# Patient Record
Sex: Female | Born: 1945 | Race: White | Hispanic: No | Marital: Married | Smoking: Former smoker
Health system: Southern US, Community
[De-identification: ages and names within clinical notes are randomized; demographics above are authoritative.]

## PROBLEM LIST (undated history)

## (undated) DIAGNOSIS — C801 Malignant (primary) neoplasm, unspecified: Secondary | ICD-10-CM

## (undated) DIAGNOSIS — I10 Essential (primary) hypertension: Secondary | ICD-10-CM

## (undated) HISTORY — PX: ABDOMINAL HYSTERECTOMY: SHX81

## (undated) HISTORY — PX: ABDOMINAL SURGERY: SHX537

---

## 2001-04-06 ENCOUNTER — Ambulatory Visit (HOSPITAL_COMMUNITY): Admission: RE | Admit: 2001-04-06 | Discharge: 2001-04-06 | Payer: Self-pay | Admitting: Family Medicine

## 2001-04-06 ENCOUNTER — Encounter: Payer: Self-pay | Admitting: Family Medicine

## 2001-05-10 ENCOUNTER — Ambulatory Visit (HOSPITAL_COMMUNITY): Admission: RE | Admit: 2001-05-10 | Discharge: 2001-05-11 | Payer: Self-pay | Admitting: Otolaryngology

## 2017-05-18 ENCOUNTER — Emergency Department (HOSPITAL_COMMUNITY): Payer: Medicare HMO

## 2017-05-18 ENCOUNTER — Emergency Department (HOSPITAL_COMMUNITY)
Admission: EM | Admit: 2017-05-18 | Discharge: 2017-05-18 | Disposition: A | Discharge status: Home / Self Care | Payer: Medicare HMO | Attending: Emergency Medicine | Admitting: Emergency Medicine

## 2017-05-18 ENCOUNTER — Encounter (HOSPITAL_COMMUNITY): Payer: Self-pay | Admitting: *Deleted

## 2017-05-18 DIAGNOSIS — R0789 Other chest pain: Secondary | ICD-10-CM | POA: Diagnosis present

## 2017-05-18 DIAGNOSIS — M545 Low back pain: Secondary | ICD-10-CM | POA: Insufficient documentation

## 2017-05-18 DIAGNOSIS — Z5321 Procedure and treatment not carried out due to patient leaving prior to being seen by health care provider: Secondary | ICD-10-CM | POA: Diagnosis not present

## 2017-05-18 HISTORY — DX: Essential (primary) hypertension: I10

## 2017-05-18 HISTORY — DX: Malignant (primary) neoplasm, unspecified: C80.1

## 2017-05-18 LAB — CBC
HCT: 34.1 % — ABNORMAL LOW (ref 36.0–46.0)
HEMOGLOBIN: 10.8 g/dL — AB (ref 12.0–15.0)
MCH: 30.6 pg (ref 26.0–34.0)
MCHC: 31.7 g/dL (ref 30.0–36.0)
MCV: 96.6 fL (ref 78.0–100.0)
Platelets: 480 10*3/uL — ABNORMAL HIGH (ref 150–400)
RBC: 3.53 MIL/uL — AB (ref 3.87–5.11)
RDW: 17.1 % — ABNORMAL HIGH (ref 11.5–15.5)
WBC: 11.4 10*3/uL — ABNORMAL HIGH (ref 4.0–10.5)

## 2017-05-18 LAB — BASIC METABOLIC PANEL
ANION GAP: 12 (ref 5–15)
BUN: 24 mg/dL — ABNORMAL HIGH (ref 6–20)
CALCIUM: 8.4 mg/dL — AB (ref 8.9–10.3)
CO2: 25 mmol/L (ref 22–32)
Chloride: 100 mmol/L — ABNORMAL LOW (ref 101–111)
Creatinine, Ser: 1.02 mg/dL — ABNORMAL HIGH (ref 0.44–1.00)
GFR, EST NON AFRICAN AMERICAN: 54 mL/min — AB (ref >60)
Glucose, Bld: 132 mg/dL — ABNORMAL HIGH (ref 65–99)
Potassium: 3.4 mmol/L — ABNORMAL LOW (ref 3.5–5.1)
Sodium: 137 mmol/L (ref 135–145)

## 2017-05-18 LAB — I-STAT TROPONIN, ED: TROPONIN I, POC: 0 ng/mL (ref 0.00–0.08)

## 2017-05-18 NOTE — ED Triage Notes (Signed)
Pt had some abdominal pain on Friday and now resolved.  Pt has mid chest pain that started on Saturday intermittently and now more consistent and higher pain. Pt reports lower back pain.  Pt has had decreased appetite.   Pt has stage 4 colon cancer and on oral chemo.

## 2017-05-18 NOTE — ED Notes (Signed)
Updated husband on expected wait times. Husband reports they will return tomorrow to get results from medical records.

## 2017-05-20 ENCOUNTER — Other Ambulatory Visit: Payer: Self-pay

## 2017-05-20 ENCOUNTER — Emergency Department (HOSPITAL_COMMUNITY): Payer: Medicare HMO

## 2017-05-20 ENCOUNTER — Emergency Department (HOSPITAL_COMMUNITY)
Admission: EM | Admit: 2017-05-20 | Discharge: 2017-05-21 | Disposition: A | Discharge status: Home / Self Care | Payer: Medicare HMO | Attending: Emergency Medicine | Admitting: Emergency Medicine

## 2017-05-20 ENCOUNTER — Encounter (HOSPITAL_COMMUNITY): Payer: Self-pay | Admitting: *Deleted

## 2017-05-20 DIAGNOSIS — K56609 Unspecified intestinal obstruction, unspecified as to partial versus complete obstruction: Secondary | ICD-10-CM | POA: Diagnosis not present

## 2017-05-20 DIAGNOSIS — I10 Essential (primary) hypertension: Secondary | ICD-10-CM | POA: Diagnosis not present

## 2017-05-20 DIAGNOSIS — K769 Liver disease, unspecified: Secondary | ICD-10-CM | POA: Insufficient documentation

## 2017-05-20 DIAGNOSIS — R1084 Generalized abdominal pain: Secondary | ICD-10-CM | POA: Diagnosis not present

## 2017-05-20 DIAGNOSIS — R188 Other ascites: Secondary | ICD-10-CM | POA: Diagnosis not present

## 2017-05-20 DIAGNOSIS — R413 Other amnesia: Secondary | ICD-10-CM | POA: Insufficient documentation

## 2017-05-20 DIAGNOSIS — R109 Unspecified abdominal pain: Secondary | ICD-10-CM | POA: Diagnosis present

## 2017-05-20 DIAGNOSIS — Z87891 Personal history of nicotine dependence: Secondary | ICD-10-CM | POA: Insufficient documentation

## 2017-05-20 DIAGNOSIS — Z85038 Personal history of other malignant neoplasm of large intestine: Secondary | ICD-10-CM | POA: Insufficient documentation

## 2017-05-20 DIAGNOSIS — Z79899 Other long term (current) drug therapy: Secondary | ICD-10-CM | POA: Insufficient documentation

## 2017-05-20 DIAGNOSIS — R079 Chest pain, unspecified: Secondary | ICD-10-CM | POA: Insufficient documentation

## 2017-05-20 LAB — BASIC METABOLIC PANEL
ANION GAP: 11 (ref 5–15)
BUN: 25 mg/dL — AB (ref 6–20)
CALCIUM: 8 mg/dL — AB (ref 8.9–10.3)
CO2: 23 mmol/L (ref 22–32)
Chloride: 100 mmol/L — ABNORMAL LOW (ref 101–111)
Creatinine, Ser: 1.06 mg/dL — ABNORMAL HIGH (ref 0.44–1.00)
GFR calc Af Amer: 59 mL/min — ABNORMAL LOW (ref >60)
GFR, EST NON AFRICAN AMERICAN: 51 mL/min — AB (ref >60)
Glucose, Bld: 138 mg/dL — ABNORMAL HIGH (ref 65–99)
Potassium: 3.4 mmol/L — ABNORMAL LOW (ref 3.5–5.1)
Sodium: 134 mmol/L — ABNORMAL LOW (ref 135–145)

## 2017-05-20 LAB — CBC
HCT: 35 % — ABNORMAL LOW (ref 36.0–46.0)
HEMOGLOBIN: 11.2 g/dL — AB (ref 12.0–15.0)
MCH: 30.5 pg (ref 26.0–34.0)
MCHC: 32 g/dL (ref 30.0–36.0)
MCV: 95.4 fL (ref 78.0–100.0)
Platelets: 479 10*3/uL — ABNORMAL HIGH (ref 150–400)
RBC: 3.67 MIL/uL — ABNORMAL LOW (ref 3.87–5.11)
RDW: 17.5 % — ABNORMAL HIGH (ref 11.5–15.5)
WBC: 13.7 10*3/uL — ABNORMAL HIGH (ref 4.0–10.5)

## 2017-05-20 LAB — I-STAT TROPONIN, ED
TROPONIN I, POC: 0.01 ng/mL (ref 0.00–0.08)
Troponin i, poc: 0.02 ng/mL (ref 0.00–0.08)

## 2017-05-20 MED ORDER — SODIUM CHLORIDE 0.9 % IV BOLUS (SEPSIS)
1000.0000 mL | Freq: Once | INTRAVENOUS | Status: AC
  Administered 2017-05-20: 1000 mL via INTRAVENOUS

## 2017-05-20 MED ORDER — IOPAMIDOL (ISOVUE-300) INJECTION 61%
INTRAVENOUS | Status: AC
  Filled 2017-05-20: qty 100

## 2017-05-20 MED ORDER — FENTANYL CITRATE (PF) 100 MCG/2ML IJ SOLN
25.0000 ug | Freq: Once | INTRAMUSCULAR | Status: AC
  Administered 2017-05-20: 25 ug via INTRAVENOUS

## 2017-05-20 MED ORDER — FENTANYL CITRATE (PF) 100 MCG/2ML IJ SOLN
INTRAMUSCULAR | Status: AC
  Filled 2017-05-20: qty 2

## 2017-05-20 NOTE — ED Triage Notes (Signed)
Pt was here on 9/12 but left prior to being seen by edp. Pt having chest pain that started as intermittent and then became constant and severe. Had episodes of n/v. Hx of colon cancer. ekg done and no acute distress is noted at triage.

## 2017-05-20 NOTE — ED Provider Notes (Signed)
[facility] DEPT Provider Note   CSN: [id number] Arrival date & time: 05/20/17  1501     History   Chief Complaint Chief Complaint  Patient presents with  . Chest Pain  . Abdominal Pain    HPI Nichole Wolf is a 71 y.o. female.  HPI Over the last week has developed severe pain in abdomen and chest, under sternum. Has been coming on for a while, scheduled to go to [place] but canceled due to symptoms.  Has lost weight due to surgery 2 years aog, but has been losing weight again, low appetite.  500 calories per day per husband.     Husband reports 2 weeks of abdominal pain.  Patient denies having any symptoms but husband reports she has been having severe pain, keeping her from sleep.  She remembers keeping him up last night but does not remember where pain was.  He reports her not remembering is new.  She says "well I'm not going senile!"    This last week was worse.  Often throughout the day.  Was laying down most of the week.  When awake seemed to be in pain. Usually points to high area in belly.  Will report chest pain at different times as abdominal pain.  Abdominal pain not immediately after eating but suspect it seems like it is worse after eating. Last night had chicken noodle soup.  Last night had severe pain after eating.    No shortness of breath. One episode of vomiting days ago.  Constipation, took senakot 10 days ago, other than that has had normal BM.      Past Medical History:  Diagnosis Date  . Cancer ([place])    stage 4 colon cancer on oral chemo  . Hypertension     There are no active problems to display for this patient.   Past Surgical History:  Procedure Laterality Date  . ABDOMINAL HYSTERECTOMY    . ABDOMINAL SURGERY     colon resection    OB History    No data available       Home Medications    Prior to Admission medications   Medication Sig Start Date End Date Taking? Authorizing Provider  amLODipine (NORVASC) 5 MG tablet Take 5 mg by  mouth daily.   Yes [provider]  Bevacizumab (AVASTIN IV) Inject into the vein every 21 ( twenty-one) days. Last infusion 05/03/17   Yes [provider]  capecitabine (XELODA) 500 MG tablet Take 1,000 mg by mouth See admin instructions. Take 2 tablets (1000 mg) by mouth twice daily for two weeks, stop for one week and then repeat.   Yes [provider]  gabapentin (NEURONTIN) 300 MG capsule Take 300 mg by mouth at bedtime.   Yes [provider]  ibuprofen (ADVIL,MOTRIN) 200 MG tablet Take 200-400 mg by mouth every 6 (six) hours as needed (pain).   Yes [provider]  Ibuprofen-Diphenhydramine Cit (ADVIL PM PO) Take 1-2 tablets by mouth at bedtime as needed (pain).   Yes [provider]  LORazepam (ATIVAN) 1 MG tablet Take 1 mg by mouth 2 (two) times daily as needed for anxiety or sleep.   Yes [provider]  mirtazapine (REMERON) 30 MG tablet Take 30 mg by mouth at bedtime.   Yes [provider]  olmesartan (BENICAR) 40 MG tablet Take 40 mg by mouth daily.   Yes [provider]  ondansetron (ZOFRAN) 8 MG tablet Take 8 mg by mouth every 8 (  eight) hours as needed for nausea or vomiting.   Yes [provider]  senna (SENOKOT) 8.6 MG tablet Take 1 tablet by mouth daily as needed for constipation.   Yes [provider]  venlafaxine XR (EFFEXOR-XR) 150 MG 24 hr capsule Take 150 mg by mouth See admin instructions. Take 1 capsule (150 mg) by mouth with 75 mg capsule for a 225 mg dose - every morning   Yes [provider]  venlafaxine XR (EFFEXOR-XR) 75 MG 24 hr capsule Take 75 mg by mouth See admin instructions. Take 1 capsule (75 mg) by mouth with 150 mg capsule for a 225 mg dose - every morning   Yes [provider]    Family History History reviewed. No pertinent family history.  Social History Social History  Substance Use Topics  . Smoking status: Former Research scientist (life sciences)  . Smokeless  tobacco: Never Used  . Alcohol use No     Allergies   Sulfa antibiotics   Review of Systems Review of Systems  Constitutional: Positive for appetite change and fatigue. Negative for fever.  HENT: Negative for sore throat.   Eyes: Negative for visual disturbance.  Respiratory: Negative for cough and shortness of breath.   Cardiovascular: Positive for chest pain.  Gastrointestinal: Positive for abdominal pain. Negative for diarrhea, nausea and vomiting.  Genitourinary: Negative for difficulty urinating and dysuria.  Musculoskeletal: Negative for back pain and neck pain.  Skin: Negative for rash.  Neurological: Negative for syncope and headaches.     Physical Exam Updated Vital Signs BP 134/84   Pulse (!) 101   Temp 98 F (36.7 C) (Oral)   Resp 15   SpO2 95%   Physical Exam  Constitutional: She appears cachectic. No distress.  HENT:  Head: Normocephalic and atraumatic.  Eyes: Conjunctivae and EOM are normal.  Neck: Normal range of motion.  Cardiovascular: Normal rate, regular rhythm, normal heart sounds and intact distal pulses.  Exam reveals no gallop and no friction rub.   No murmur heard. Pulmonary/Chest: Effort normal and breath sounds normal. No respiratory distress. She has no wheezes. She has no rales. She exhibits tenderness.  Abdominal: Soft. She exhibits no distension. There is tenderness (diffuse). There is no guarding.  Musculoskeletal: She exhibits no edema or tenderness.  Neurological: She is alert.  Skin: Skin is warm and dry. No rash noted. She is not diaphoretic. No erythema.  Psychiatric: She is agitated.  Nursing note and vitals reviewed.    ED Treatments / Results  Labs (all labs ordered are listed, but only abnormal results are displayed) Labs Reviewed  BASIC METABOLIC PANEL - Abnormal; Notable for the following:       Result Value   Sodium 134 (*)    Potassium 3.4 (*)    Chloride 100 (*)    Glucose, Bld 138 (*)    BUN 25 (*)     Creatinine, Ser 1.06 (*)    Calcium 8.0 (*)    GFR calc non Af Amer 51 (*)    GFR calc Af Amer 59 (*)    All other components within normal limits  CBC - Abnormal; Notable for the following:    WBC 13.7 (*)    RBC 3.67 (*)    Hemoglobin 11.2 (*)    HCT 35.0 (*)    RDW 17.5 (*)    Platelets 479 (*)    All other components within normal limits  HEPATIC FUNCTION PANEL - Abnormal; Notable for the following:  Total Protein 5.1 (*)    Albumin 2.4 (*)    ALT 11 (*)    Alkaline Phosphatase 139 (*)    All other components within normal limits  LIPASE, BLOOD  AMMONIA  I-STAT TROPONIN, ED  I-STAT TROPONIN, ED    EKG  EKG Interpretation  Date/Time:  Friday May 20 2017 15:08:55 EDT Ventricular Rate:  113 PR Interval:  134 QRS Duration: 74 QT Interval:  340 QTC Calculation: 466 R Axis:   69 Text Interpretation:  Sinus tachycardia Otherwise normal ECG No significant change since last tracing Confirmed by Gareth Morgan [phone]) on 05/20/2017 8:31:03 PM Also confirmed by Gareth Morgan [phone]), editor Philomena Doheny [phone])  on 05/21/2017 9:15:19 AM       Radiology Ct Head Wo Contrast  Result Date: 05/21/2017 CLINICAL DATA:  Diffuse pain. History of hypertension, stage IV colon cancer. EXAM: CT HEAD WITHOUT CONTRAST TECHNIQUE: Contiguous axial images were obtained from the base of the skull through the vertex without intravenous contrast. COMPARISON:  None. FINDINGS: BRAIN: No intraparenchymal hemorrhage, mass effect nor midline shift. The ventricles and sulci are normal for age. Patchy supratentorial white matter hypodensities less than expected for patient's age, though non-specific are most compatible with chronic small vessel ischemic disease. No acute large vascular territory infarcts. No abnormal extra-axial fluid collections. Basal cisterns are patent. VASCULAR: Mild calcific atherosclerosis of the carotid siphons. SKULL: No skull fracture. Moderate RIGHT temporomandibular  osteoarthrosis. No significant scalp soft tissue swelling. SINUSES/ORBITS: Trace paranasal sinus mucosal thickening. Mastoid air cells are well aerated. Soft tissue within the external auditory canals compatible with cerumen. The included ocular globes and orbital contents are non-suspicious. OTHER: Subcutaneous gas RIGHT face and included palate. Soft tissue density nasal labial fold seen with filler material. IMPRESSION: 1. Normal noncontrast CT HEAD for age. 2. Subcutaneous gas RIGHT lower face, this may be within the oral cavity, from recent intravenous access for subcutaneous injection. Recommend direct inspection. Electronically Signed   By: Elon Alas M.D.   On: 05/21/2017 00:19   Ct Abdomen Pelvis W Contrast  Result Date: 05/21/2017 CLINICAL DATA:  Abdominal pain. Patient reports pain everywhere for an unspecified amount of time. EXAM: CT ABDOMEN AND PELVIS WITH CONTRAST TECHNIQUE: Multidetector CT imaging of the abdomen and pelvis was performed using the standard protocol following bolus administration of intravenous contrast. CONTRAST:  100 cc Isovue-300 IV COMPARISON:  None. FINDINGS: Lower chest: Lower lobe atelectasis. No pleural fluid. Fluid distending the distal esophagus with wall thickening. Hepatobiliary: 4.2 cm low-density lesion in the right lobe of the liver, borderline Hounsfield units of 20 for simple cyst. Subcapsular low-density lesion in the inferior right lobe is elongated, incompletely characterized. There is perihepatic ascites. Gallbladder wall thickening is nonspecific in the setting of ascites. No calcified stone. No biliary dilatation. Pancreas: Atrophic parenchyma. No ductal dilatation or inflammation. Spleen: Normal in size without focal abnormality. Adrenals/Urinary Tract: No adrenal nodule. Moderate to severe right hydronephrosis, likely chronic with marked thinning of the right renal parenchyma. Right ureter appears decompressed. Compensatory hypertrophy of the left  kidney. Multiple low-density lesions within both kidneys are incompletely characterize but likely cysts. Urinary bladder is physiologically distended without wall thickening. Stomach/Bowel: Bowel anatomy is limited given lack of enteric contrast and paucity of intra-abdominal fat. Fluid distends distal esophagus with wall thickening. Fluid within the stomach which is nondistended. Fluid-filled markedly dilated small bowel loops in the upper abdomen. Possible transition point in the central abdomen image 29 series 3. More distal small bowel loops  are less distended but also fluid-filled. Fluid throughout the ascending, transverse and descending colon. Enteric sutures at the base of the cecum. There is diffuse mucosal enhancement throughout small and large bowel. Vascular/Lymphatic: Mild aortic tortuosity without aneurysm. No bulky adenopathy. Reproductive: Status post hysterectomy. No adnexal masses. Other: Upper abdominal ascites, may be partially loculated with peripheral enhancement. No free air. Musculoskeletal: Scoliosis and degenerative change in the spine. Scattered sclerotic densities in the pelvis. No destructive bone lesion. IMPRESSION: 1. Findings suspicious for small bowel obstruction with marked distention of upper small bowel loops, possible transition point in the central abdomen. However, some small bowel loops distally are also fluid-filled, as well as fluid throughout the colon. There is diffuse mucosal enhancement throughout the large and small bowel, raising concern for concurrent enteritis. 2. Small to moderate upper abdominal ascites may be partially loculated with peripheral enhancement, suggesting complex fluid. 3. Pericholecystic fluid is nonspecific in the setting of ascites, likely reactive. 4. Liver lesions, largest measuring 4.2 cm, likely a complex cyst. Recommend correlation with previous imaging if available. In the absence of previous imaging, consider MR characterization on an  elective basis after coalescence of acute illness when patient is able tolerate breath hold technique. 5. Chronic right hydronephrosis with renal parenchymal thinning, may be congenital UPJ obstruction. Bilateral low-density renal lesions are likely cysts but incompletely characterized. Electronically Signed   By: Jeb Levering M.D.   On: 05/21/2017 00:33    Procedures Procedures (including critical care time)  Medications Ordered in ED Medications  sodium chloride 0.9 % bolus 1,000 mL (0 mLs Intravenous Stopped 05/21/17 0147)  fentaNYL (SUBLIMAZE) injection 25 mcg (25 mcg Intravenous Given 05/20/17 2332)     Initial Impression / Assessment and Plan / ED Course  I have reviewed the triage vital signs and the nursing notes.  Pertinent labs & imaging results that were available during my care of the patient were reviewed by me and considered in my medical decision making (see chart for details).     71 year old female with a history of stage IV colon cancer and hypertension presents with concern for abdominal pain and chest pain. History is limited as patient denies chest pain, and does not recall details of her abdominal pain, however husband is providing much history. EKG shows no acute changes. Troponin is within normal limits. Chest x-ray done 2 days ago shows no sign of pneumothorax or pneumonia.She and husband deny shortness of breath, she has normal oxygenation on room air, no pleuritic component of pain, no asymmetricleg pain or swelling, and overall given patient's denial of chest pain with husbands report of only sternal pain, doubt pulmonary embolus. Doubt ACS given negative troponin x 2, patient denial of chest pain.  Patient does acknowledge abdominal pain and significant tenderness but is unable to provide detailed history.  CP may be radiating from abdomen.  Given husband reporting her inability to remember details of pain is new, ordered head CT and ammonia.  Ammonia WNL. CT head  without acute findings.   Diffuse tenderness on exam, CT abdomen pelvis ordered showing possible bowel obstruction and peripherally enhancing ascitic fluid collection.  Loculated ascites noted on prior CT[facility] completed at [facility], however given presence of pain discussed with patient and husband that I cannot rule out whether this fluid may be malignant or may be infected. Given fluid has been persistent in this area and pericholecystitic I doubt that it represents fluid from acute cholecystitis.  Recommended admission and IR sampling in AM.  Regarding  radiographic finding of bowel obstruction, discussed with patient and husband that bowel obstruction is typically both clinical and radiographic diagnosis --and while she is not distended and has not had vomiting today to suggest SOB, her history is limited and she does not remember when she last had BM or if she has passed flatus today, and it is certainly possible SBO is etiology of pain. Recommend bowel rest, IV hydration, admission.   Discussed my recommendation of admission for abdominal pain, IV hydration, bowel rest, IR guided paracentesis to rule out SBP with patient and husband in detial.  Patient is adamant about getting home to her dog and after husband discussed with her he reports she is adamant about leaving and they will leave against medical advice.  Discussed I would like her to stay, and risks of bowel perforation, sepsis, death with them.  The radiographic findings are not definitive for diagnosis of bowel obstruction or SBP,as clinical history not classic for SBO, and loculated ascites present on prior scans--however I feel admission for observation and continued testing is most appropriate.  Discussed with husband that if she develops vomiting, worsening pain, fevers they must immediately return to ED. Also discussed finding of liver lesions new from prior concerning for metastatic disease.      Final Clinical Impressions(s) / ED Diagnoses    Final diagnoses:  Generalized abdominal pain  Chest pain, unspecified type  Other ascites, suspect malignant ascites, cannot rule out potential infection  Small bowel obstruction ([place]), suspected based on CT  Liver lesion, cystic, concern for metastatic disease given this was not present on prior CT    New Prescriptions Discharge Medication List as of 05/21/2017  1:39 AM       Gareth Morgan, MD 05/21/17 1050

## 2017-05-21 ENCOUNTER — Other Ambulatory Visit: Payer: Self-pay

## 2017-05-21 LAB — HEPATIC FUNCTION PANEL
ALBUMIN: 2.4 g/dL — AB (ref 3.5–5.0)
ALT: 11 U/L — AB (ref 14–54)
AST: 18 U/L (ref 15–41)
Alkaline Phosphatase: 139 U/L — ABNORMAL HIGH (ref 38–126)
BILIRUBIN DIRECT: 0.2 mg/dL (ref 0.1–0.5)
Indirect Bilirubin: 0.6 mg/dL (ref 0.3–0.9)
Total Bilirubin: 0.8 mg/dL (ref 0.3–1.2)
Total Protein: 5.1 g/dL — ABNORMAL LOW (ref 6.5–8.1)

## 2017-05-21 LAB — AMMONIA: Ammonia: 34 umol/L (ref 9–35)

## 2017-05-21 LAB — LIPASE, BLOOD: LIPASE: 33 U/L (ref 11–51)

## 2017-05-21 NOTE — ED Notes (Signed)
75 mcg Fentanyl wasted with Charm Rings

## 2017-05-21 NOTE — ED Notes (Signed)
Patient Alert and oriented X4. Stable and ambulatory. Patient verbalized understanding of the discharge instructions.  Patient belongings were taken by the patient.  

## 2017-08-06 DEATH — deceased

## 2018-02-27 IMAGING — CT CT HEAD W/O CM
4 series · 16 of 47 positions shown, 18 images · non-contrast
Comparison: None.

CLINICAL DATA: Diffuse pain. History of hypertension, stage IV
colon cancer.

EXAM:
CT HEAD WITHOUT CONTRAST
TECHNIQUE: Contiguous axial images were obtained from the base of the skull
through the vertex without intravenous contrast.

[Series 3: head wo · axial · 0.40mm/px · z∈[-186,-66]mm · 7 of 32 slices shown, 9 images]
[im 4/32  brain]
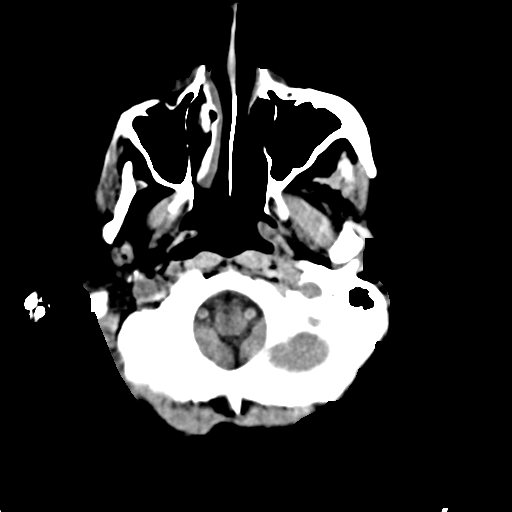
[im 4/32  bone]
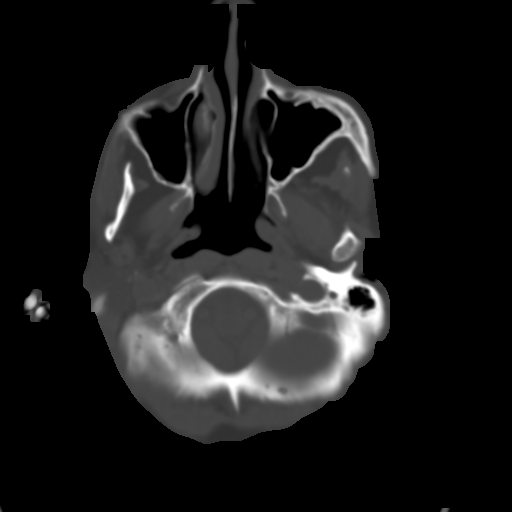
[im 8/32  brain]
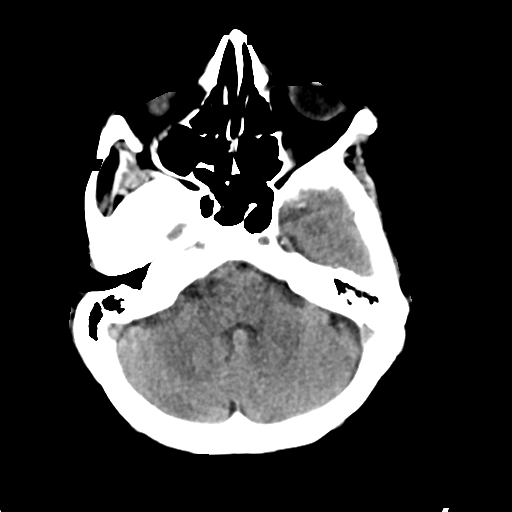
[im 12/32  brain]
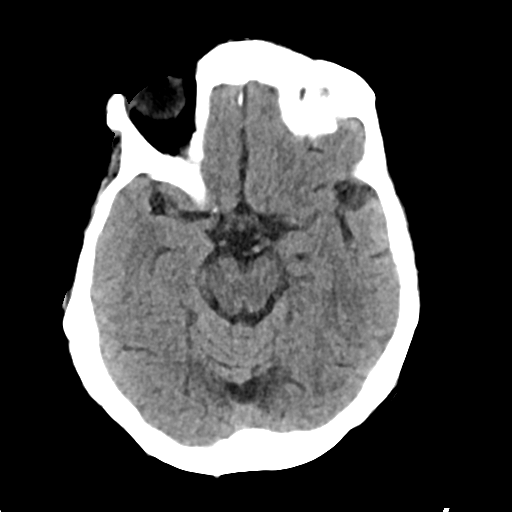
[im 16/32  brain]
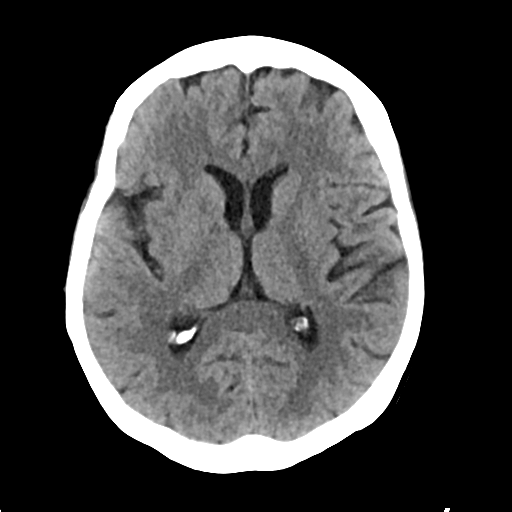
[im 20/32  brain]
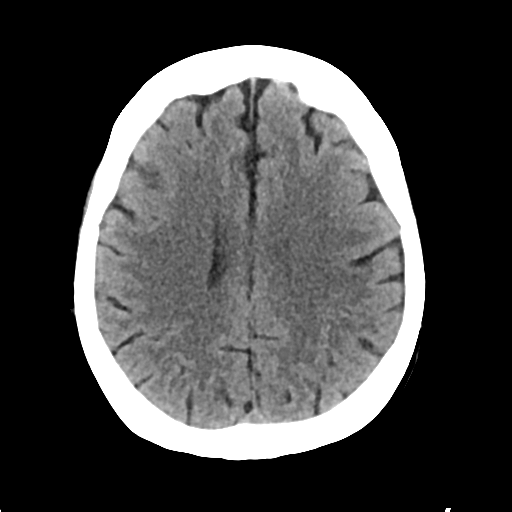
[im 20/32  bone]
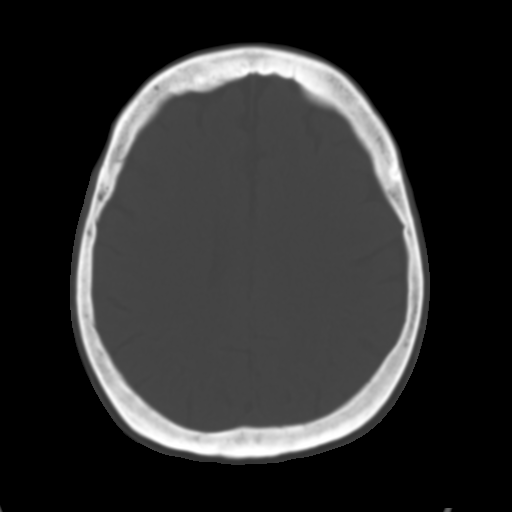
[im 24/32  brain]
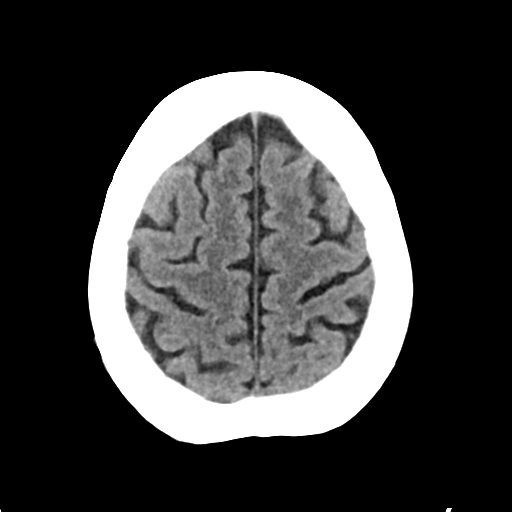
[im 28/32  brain]
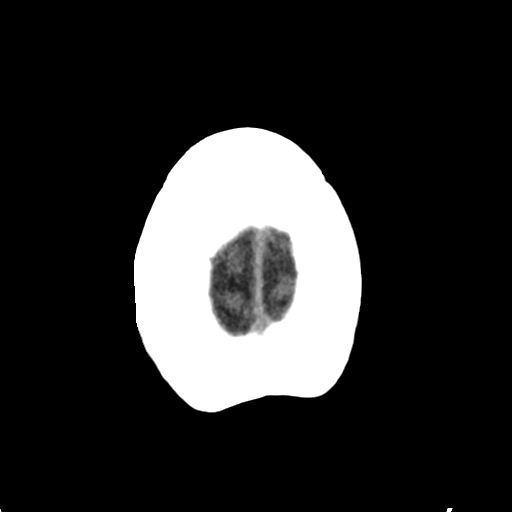

[Series 4: head bone · axial · 0.40mm/px · z∈[-188,-156]mm · 3 of 80 slices shown]
[im 8/80  bone]
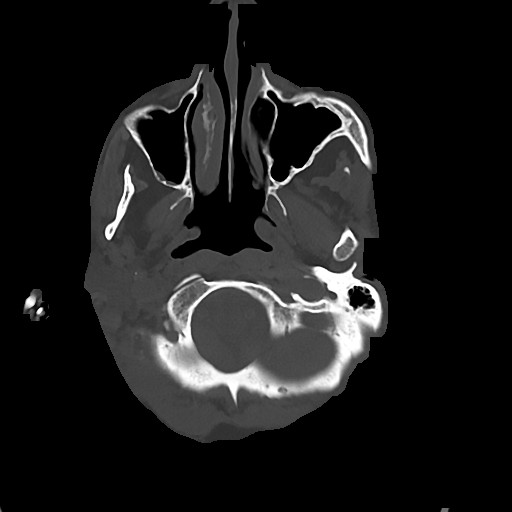
[im 16/80  bone]
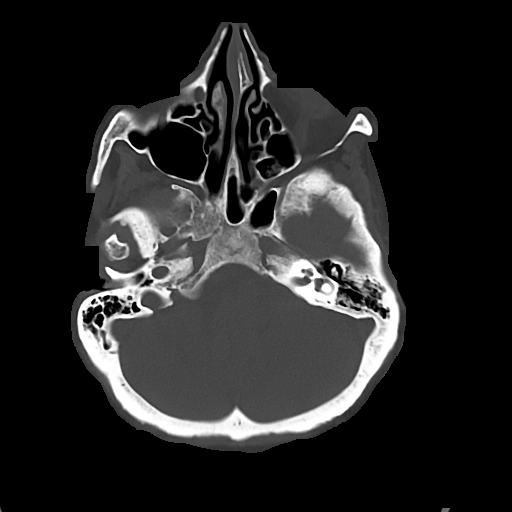
[im 24/80  bone]
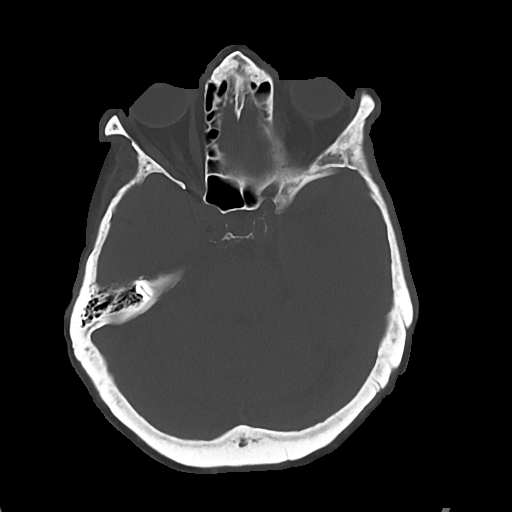

[Series 5: cor soft · coronal · 0.31mm/px · 3 of 67 slices shown]
[im 23/67  brain]
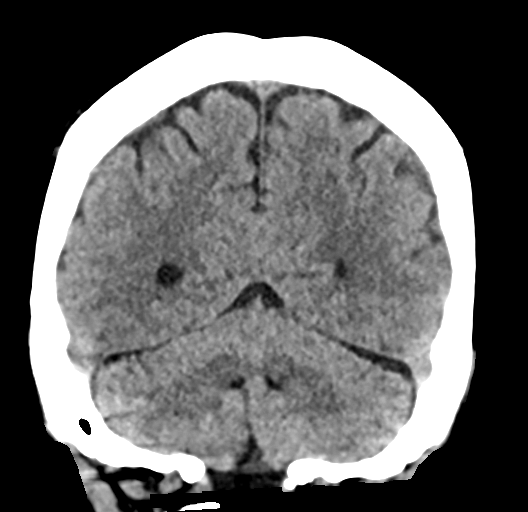
[im 30/67  brain]
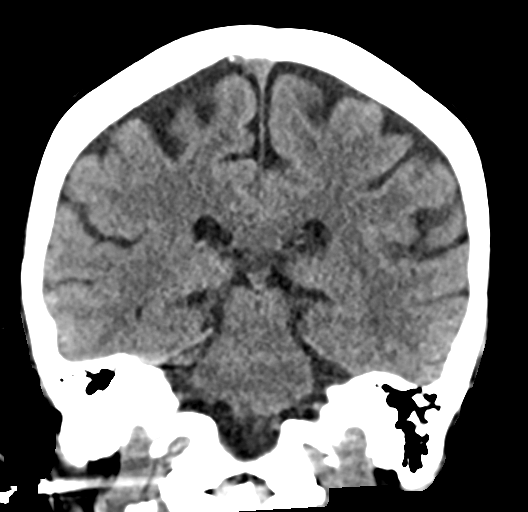
[im 37/67  brain]
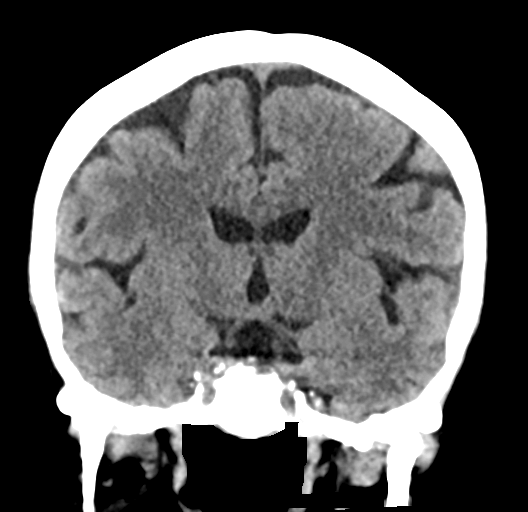

[Series 6: sag soft · sagittal · 0.31mm/px · 3 of 57 slices shown]
[im 19/57  brain]
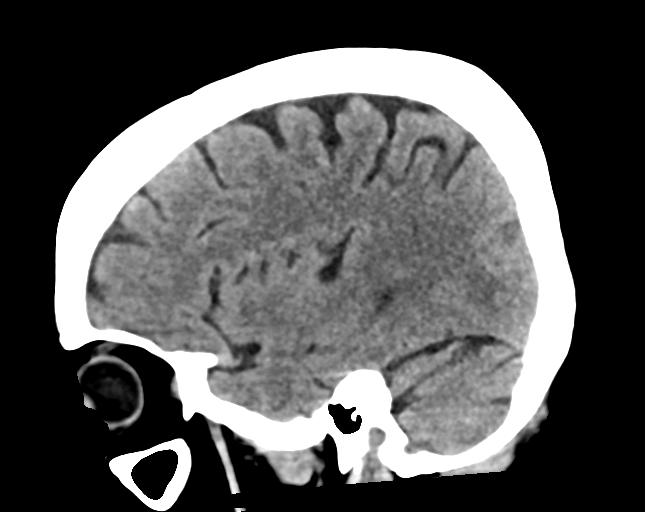
[im 29/57  brain]
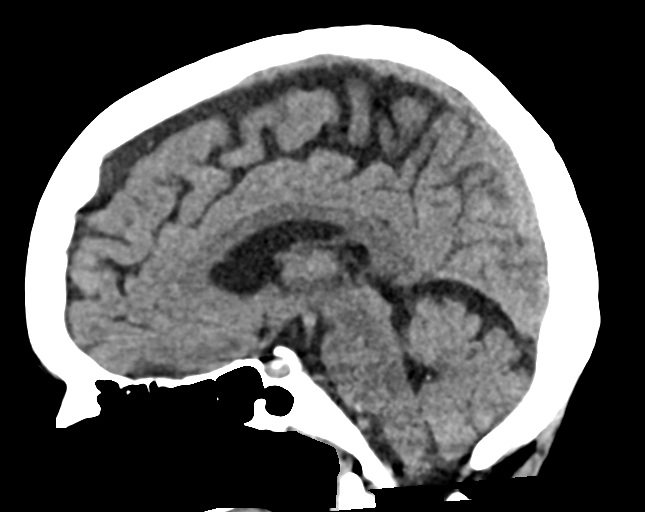
[im 38/57  brain]
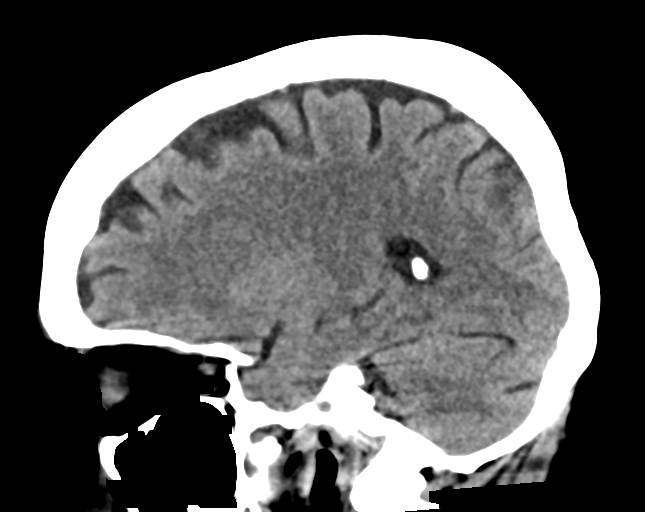

[16 of 47 positions shown; findings below may reference images not displayed]

FINDINGS: BRAIN: No intraparenchymal hemorrhage, mass effect nor midline
shift. The ventricles and sulci are normal for age. Patchy
supratentorial white matter hypodensities less than expected for
patient's age, though non-specific are most compatible with chronic
small vessel ischemic disease. No acute large vascular territory
infarcts. No abnormal extra-axial fluid collections. Basal cisterns
are patent.

VASCULAR: Mild calcific atherosclerosis of the carotid siphons.

SKULL: No skull fracture. Moderate RIGHT temporomandibular
osteoarthrosis. No significant scalp soft tissue swelling.

SINUSES/ORBITS: Trace paranasal sinus mucosal thickening. Mastoid
air cells are well aerated. Soft tissue within the external auditory
canals compatible with cerumen. The included ocular globes and
orbital contents are non-suspicious.

OTHER: Subcutaneous gas RIGHT face and included palate. Soft tissue
density nasal labial fold seen with filler material.
IMPRESSION: 1. Normal noncontrast CT HEAD for age.
2. Subcutaneous gas RIGHT lower face, this may be within the oral
cavity, from recent intravenous access for subcutaneous injection.
Recommend direct inspection.
